# Patient Record
Sex: Male | Born: 2000 | Race: Black or African American | Hispanic: No | Marital: Single | Smoking: Never smoker
Health system: Southern US, Community
[De-identification: ages and names within clinical notes are randomized; demographics above are authoritative.]

## PROBLEM LIST (undated history)

## (undated) DIAGNOSIS — J45909 Unspecified asthma, uncomplicated: Secondary | ICD-10-CM

## (undated) HISTORY — PX: LEG SURGERY: SHX1003

## (undated) HISTORY — PX: EYE SURGERY: SHX253

---
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
## 2018-07-08 ENCOUNTER — Encounter (HOSPITAL_BASED_OUTPATIENT_CLINIC_OR_DEPARTMENT_OTHER): Payer: Self-pay | Admitting: *Deleted

## 2018-07-08 ENCOUNTER — Emergency Department (HOSPITAL_BASED_OUTPATIENT_CLINIC_OR_DEPARTMENT_OTHER)
Admission: EM | Admit: 2018-07-08 | Discharge: 2018-07-09 | Disposition: A | Discharge status: Home / Self Care | Payer: 59 | Attending: Emergency Medicine | Admitting: Emergency Medicine

## 2018-07-08 ENCOUNTER — Other Ambulatory Visit: Payer: Self-pay

## 2018-07-08 DIAGNOSIS — J029 Acute pharyngitis, unspecified: Secondary | ICD-10-CM | POA: Diagnosis not present

## 2018-07-08 DIAGNOSIS — B9789 Other viral agents as the cause of diseases classified elsewhere: Secondary | ICD-10-CM | POA: Diagnosis not present

## 2018-07-08 DIAGNOSIS — J45909 Unspecified asthma, uncomplicated: Secondary | ICD-10-CM | POA: Insufficient documentation

## 2018-07-08 DIAGNOSIS — Z79899 Other long term (current) drug therapy: Secondary | ICD-10-CM | POA: Insufficient documentation

## 2018-07-08 LAB — GROUP A STREP BY PCR: GROUP A STREP BY PCR: NOT DETECTED

## 2018-07-08 MED ORDER — DEXAMETHASONE 4 MG PO TABS
8.0000 mg | ORAL_TABLET | Freq: Once | ORAL | Status: AC
  Administered 2018-07-08: 8 mg via ORAL
  Filled 2018-07-08: qty 2

## 2018-07-08 NOTE — ED Notes (Signed)
C/o sorethroat x 2 days,  Runny nose and some congestion

## 2018-07-08 NOTE — ED Provider Notes (Signed)
  [facility] EMERGENCY DEPARTMENT Provider Note   CSN: [id number] Arrival date & time: 07/08/18  2231     History   Chief Complaint Chief Complaint  Patient presents with  . Sore Throat    HPI Anthony Hays is a 17 y.o. male.  The history is provided by the patient.  Sore Throat  This is a new problem. The current episode started 2 days ago. The problem occurs hourly. The problem has been gradually worsening. Pertinent negatives include no shortness of breath. The symptoms are aggravated by swallowing. The symptoms are relieved by rest.  Patient presents for sore throat for the past 1-2 days.  No fevers or vomiting Past Medical History:  Diagnosis Date  . Asthma     Patient Active Problem List   Diagnosis Date Noted  . Right knee injury, subsequent encounter 07/07/2017    Past Surgical History:  Procedure Laterality Date  . EYE SURGERY    . LEG SURGERY          Home Medications    Prior to Admission medications   Medication Sig Start Date End Date Taking? Authorizing Provider  Escitalopram Oxalate (LEXAPRO PO) Take by mouth.   Yes [provider]    Family History No family history on file.  Social History Social History   Tobacco Use  . Smoking status: Never Smoker  . Smokeless tobacco: Never Used  Substance Use Topics  . Alcohol use: No  . Drug use: No     Allergies   Patient has no known allergies.   Review of Systems Review of Systems  Constitutional: Positive for fatigue.  HENT: Positive for sore throat.   Respiratory: Negative for shortness of breath.      Physical Exam Updated Vital Signs BP 127/73   Pulse 68   Temp 98.8 F (37.1 C) (Oral)   Resp 20   Ht 1.676 m (5\' 6" )   Wt 50.8 kg   SpO2 99%   BMI 18.08 kg/m   Physical Exam CONSTITUTIONAL: Well developed/well nourished HEAD: Normocephalic/atraumatic EYES: EOMI/PERRL ENMT: Mucous membranes moist, uvula midline, tonsils enlarged with erythema, no  exudates, no drooling, no stridor, normal phonation NECK: supple no meningeal signs SPINE/BACK:entire spine nontender CV: S1/S2 noted, no murmurs/rubs/gallops noted LUNGS: Lungs are clear to auscultation bilaterally, no apparent distress ABDOMEN: soft, nontender NEURO: Pt is awake/alert/appropriate, moves all extremitiesx4.   EXTREMITIES: pulses normal/equal, full ROM SKIN: warm, color normal PSYCH: no abnormalities of mood noted, alert and oriented to situation   ED Treatments / Results  Labs (all labs ordered are listed, but only abnormal results are displayed) Labs Reviewed  GROUP A STREP BY PCR    EKG None  Radiology No results found.  Procedures Procedures (including critical care time)  Medications Ordered in ED Medications  dexamethasone (DECADRON) tablet 8 mg (has no administration in time range)     Initial Impression / Assessment and Plan / ED Course  I have reviewed the triage vital signs and the nursing notes.  Pertinent labs  results that were available during my care of the patient were reviewed by me and considered in my medical decision making (see chart for details).     We will treat for viral pharyngitis.  We discussed strict return precautions  Final Clinical Impressions(s) / ED Diagnoses   Final diagnoses:  Viral pharyngitis    ED Discharge Orders    None       Zadie Rhine, MD 07/08/18 2341

## 2018-07-08 NOTE — ED Triage Notes (Signed)
Sore throat since yesterday. Exudate on his swollen tonsils.

## 2019-01-21 IMAGING — CR DG KNEE COMPLETE 4+V*R*
4 series · 4 of 4 positions shown · non-contrast
Comparison: None.

CLINICAL DATA: Patient here for f/u of right tibial plateau
fracture, states that he isn't having any right knee pains
currently, no other complaints

EXAM:
RIGHT KNEE - COMPLETE 4+ VIEW

[t knee ap right]
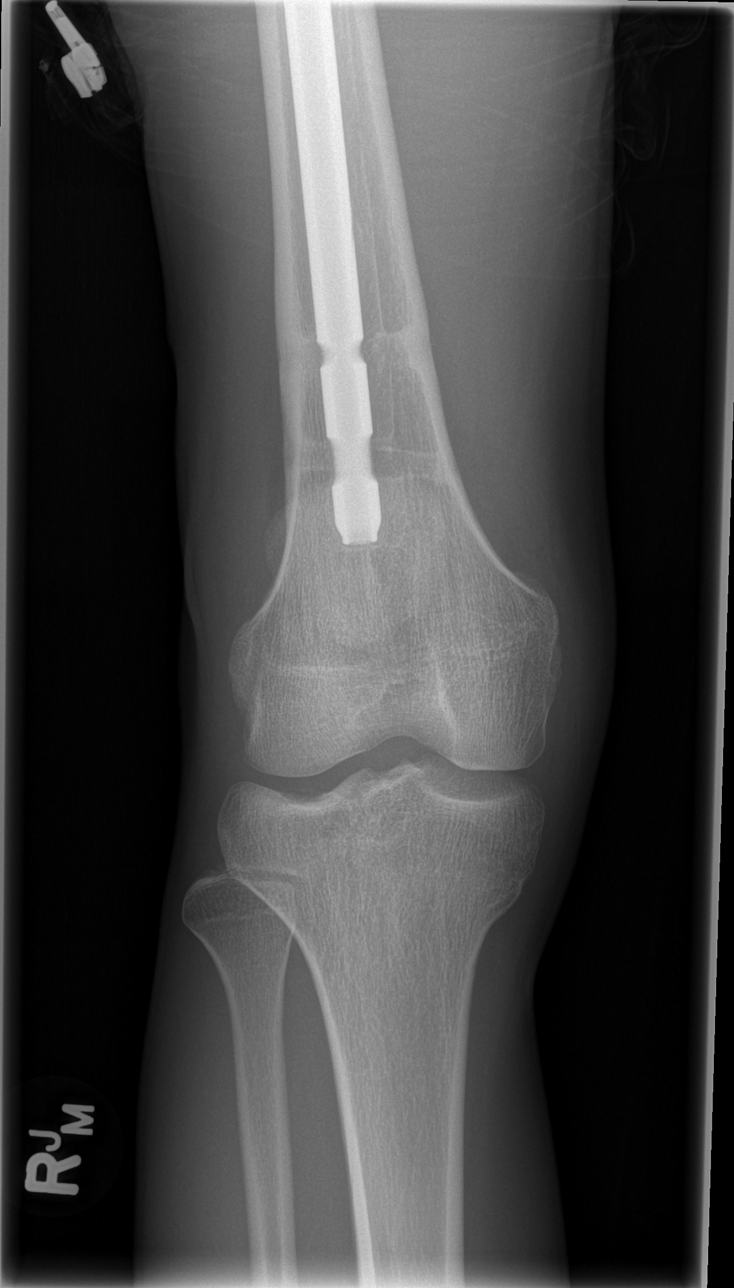

[t knee oblique right (1 of 2)]
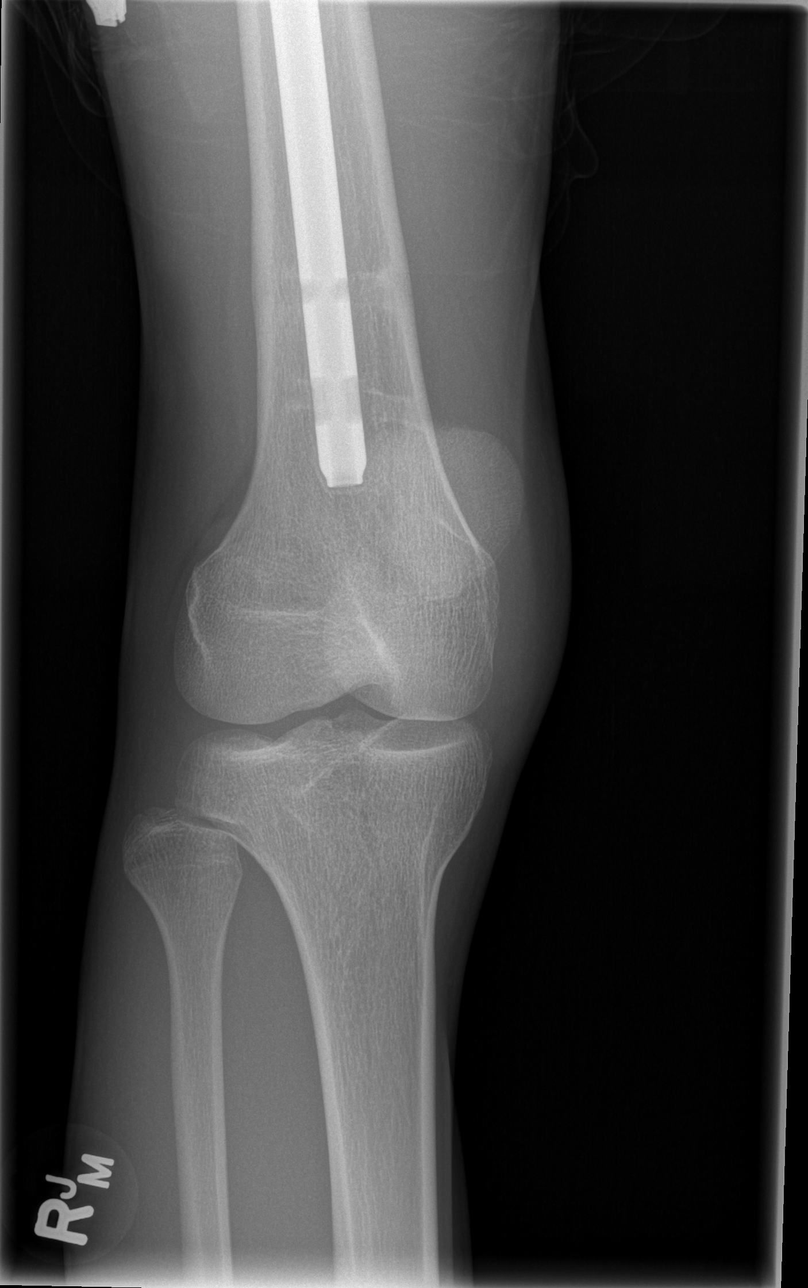

[t knee oblique right (2 of 2)]
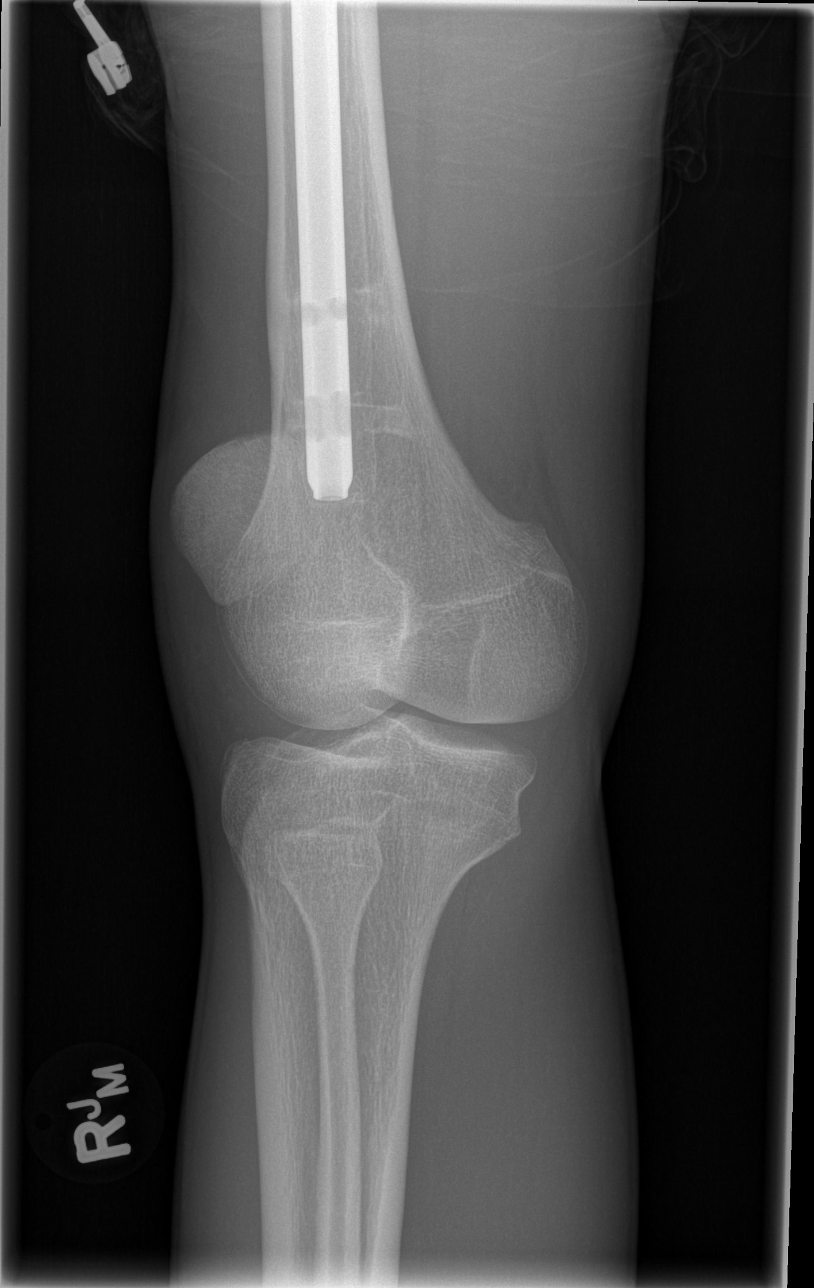

[t knee lat right]
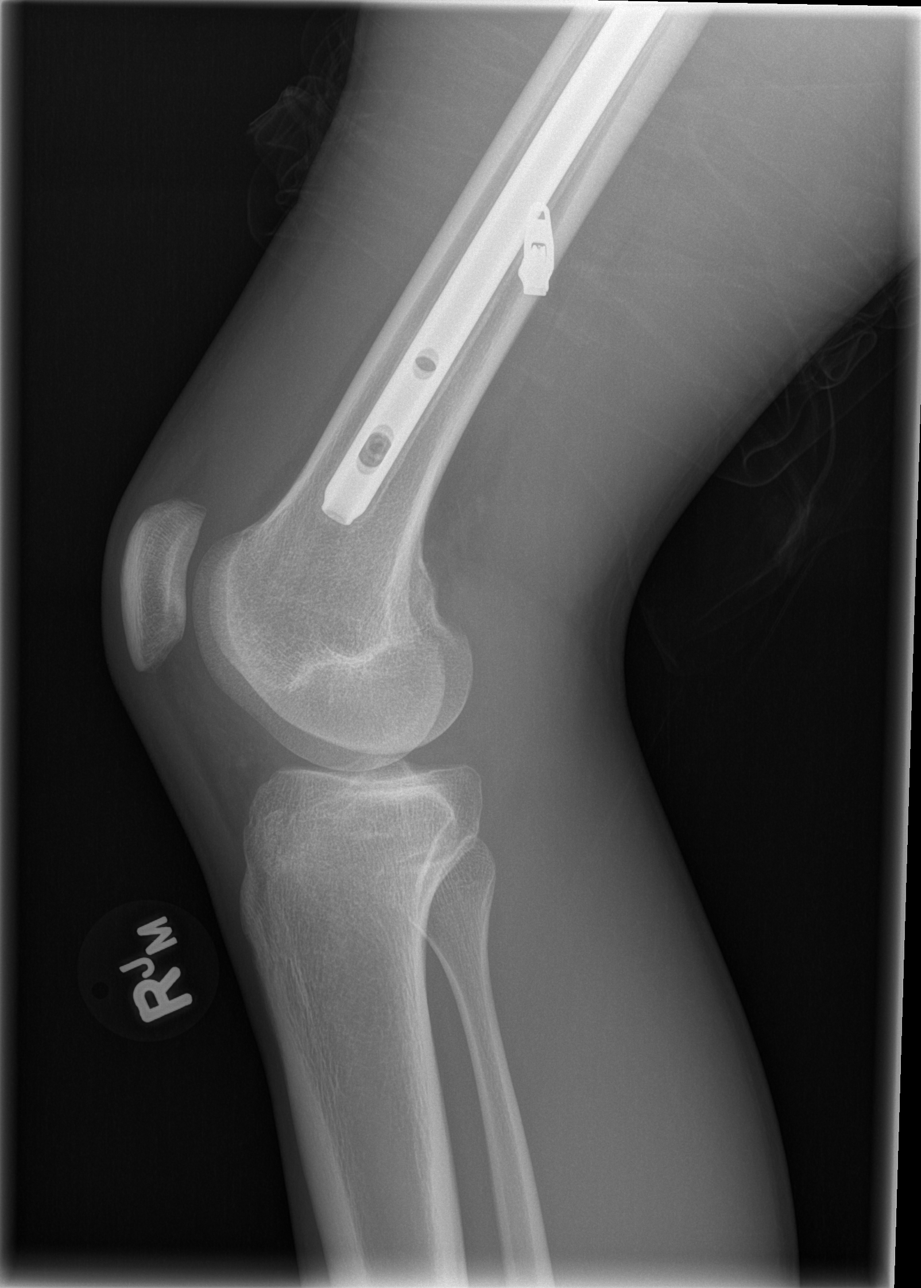

[4 of 4 positions shown; findings below may reference images not displayed]

FINDINGS: Healed lateral tibial plateau fracture with persistent 3 mm of
depression of the articular surface.

No acute fracture or dislocation. Partially visualize is a distal
femoral intramedullary nail.

No aggressive osseous lesion.  Moderate joint effusion.
IMPRESSION: 1. Healed lateral tibial plateau fracture with persistent 3 mm of
depression of the articular surface.
2. Moderate joint effusion.

## 2022-02-27 ENCOUNTER — Encounter (HOSPITAL_BASED_OUTPATIENT_CLINIC_OR_DEPARTMENT_OTHER): Payer: Self-pay | Admitting: Emergency Medicine

## 2022-02-27 ENCOUNTER — Other Ambulatory Visit: Payer: Self-pay

## 2022-02-27 ENCOUNTER — Emergency Department (HOSPITAL_BASED_OUTPATIENT_CLINIC_OR_DEPARTMENT_OTHER)
Admission: EM | Admit: 2022-02-27 | Discharge: 2022-02-27 | Disposition: A | Discharge status: Home / Self Care | Payer: Managed Care, Other (non HMO) | Source: Home / Self Care | Attending: Emergency Medicine | Admitting: Emergency Medicine

## 2022-02-27 ENCOUNTER — Emergency Department (HOSPITAL_BASED_OUTPATIENT_CLINIC_OR_DEPARTMENT_OTHER)
Admission: EM | Admit: 2022-02-27 | Discharge: 2022-02-27 | Disposition: A | Discharge status: Home / Self Care | Payer: Managed Care, Other (non HMO) | Attending: Emergency Medicine | Admitting: Emergency Medicine

## 2022-02-27 ENCOUNTER — Emergency Department (HOSPITAL_BASED_OUTPATIENT_CLINIC_OR_DEPARTMENT_OTHER): Payer: Managed Care, Other (non HMO)

## 2022-02-27 DIAGNOSIS — S0181XA Laceration without foreign body of other part of head, initial encounter: Secondary | ICD-10-CM | POA: Diagnosis not present

## 2022-02-27 DIAGNOSIS — Y9351 Activity, roller skating (inline) and skateboarding: Secondary | ICD-10-CM | POA: Insufficient documentation

## 2022-02-27 DIAGNOSIS — S62175A Nondisplaced fracture of trapezium [larger multangular], left wrist, initial encounter for closed fracture: Secondary | ICD-10-CM | POA: Diagnosis not present

## 2022-02-27 DIAGNOSIS — M25532 Pain in left wrist: Secondary | ICD-10-CM | POA: Diagnosis not present

## 2022-02-27 DIAGNOSIS — S0081XA Abrasion of other part of head, initial encounter: Secondary | ICD-10-CM | POA: Insufficient documentation

## 2022-02-27 DIAGNOSIS — S0990XD Unspecified injury of head, subsequent encounter: Secondary | ICD-10-CM

## 2022-02-27 DIAGNOSIS — S6992XA Unspecified injury of left wrist, hand and finger(s), initial encounter: Secondary | ICD-10-CM | POA: Diagnosis present

## 2022-02-27 MED ORDER — LIDOCAINE-EPINEPHRINE 2 %-1:100000 IJ SOLN: 20.0000 mL | Freq: Once | INTRAMUSCULAR | Status: DC

## 2022-02-27 MED ORDER — LIDOCAINE-EPINEPHRINE-TETRACAINE (LET) TOPICAL GEL
3.0000 mL | Freq: Once | TOPICAL | Status: AC
Start: 2022-02-27 — End: 2022-02-27
  Administered 2022-02-27: 3 mL via TOPICAL
  Filled 2022-02-27: qty 3

## 2022-02-27 MED ORDER — LIDOCAINE-EPINEPHRINE (PF) 2 %-1:200000 IJ SOLN
10.0000 mL | Freq: Once | INTRAMUSCULAR | Status: AC
  Administered 2022-02-27: 10 mL
  Filled 2022-02-27: qty 20
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
## 2022-02-27 NOTE — ED Provider Notes (Signed)
[facility] EMERGENCY DEPARTMENT Provider Note   CSN: [id number] Arrival date & time: 02/27/22  1302     History  Chief Complaint  Patient presents with   Head Injury    REFOEL SCALLION is a 21 y.o. male.  To ER chief complaint of a trickling fluid sensation in his head.  He states that he was seen here yesterday after a fall from a skateboard and head injury.  He was given stitches and discharged home.  He denies passing out he denies any nausea or vomiting denies any headache but has a "fluid trickling sensation" in his head he is concerned he may be due to some other injury.  No reports of fevers or cough or vomiting or diarrhea.  Denies any headache.      Home Medications Prior to Admission medications   Medication Sig Start Date End Date Taking? Authorizing Provider  Escitalopram Oxalate (LEXAPRO PO) Take by mouth.    [provider]      Allergies    Patient has no known allergies.    Review of Systems   Review of Systems  Constitutional:  Negative for fever.  HENT:  Negative for ear pain and sore throat.   Eyes:  Negative for pain.  Respiratory:  Negative for cough.   Cardiovascular:  Negative for chest pain.  Gastrointestinal:  Negative for abdominal pain.  Genitourinary:  Negative for flank pain.  Musculoskeletal:  Negative for back pain.  Skin:  Negative for color change and rash.  Neurological:  Negative for syncope.  All other systems reviewed and are negative.  Physical Exam Updated Vital Signs BP (!) 137/93 (BP Location: Right Arm)   Pulse 65   Temp 98.1 F (36.7 C) (Oral)   Resp 17   SpO2 99%  Physical Exam Constitutional:      Appearance: He is well-developed.  HENT:     Head: Normocephalic.     Nose: Nose normal.  Eyes:     Extraocular Movements: Extraocular movements intact.  Cardiovascular:     Rate and Rhythm: Normal rate.  Pulmonary:     Effort: Pulmonary effort is normal.  Skin:    Coloration: Skin is not  jaundiced.     Comments: Left facial abrasions, no evidence of cellulitis.  Neurological:     Mental Status: He is alert. Mental status is at baseline.    ED Results / Procedures / Treatments   Labs (all labs ordered are listed, but only abnormal results are displayed) Labs Reviewed - No data to display  EKG None  Radiology DG Wrist Complete Left  Result Date: 02/27/2022 CLINICAL DATA:  Left wrist injury EXAM: LEFT WRIST - COMPLETE 3+ VIEW COMPARISON:  None Available. FINDINGS: There is a a cortical step-off and linear lucency through the trapezium in keeping with a minimally displaced, anatomically aligned intra-articular fracture. Normal overall alignment. No other fracture or dislocation. Marked soft tissue swelling along the dorsum of the carpus. IMPRESSION: Minimally displaced anatomically aligned intra-articular fracture of the trapezium at the base of the thumb. Dorsal soft tissue swelling. Electronically Signed   By: Fidela Salisbury M.D.   On: 02/27/2022 00:27    Procedures Procedures    Medications Ordered in ED Medications - No data to display  ED Course/ Medical Decision Making/ A&P                           Medical Decision Making  Chart review  shows office visit Feb 24, 2022 for routine medical exam.  Risk and benefits of CT imaging discussed at length with the patient.  70 May to forego imaging at this time.  Patient advised outpatient follow-up with his doctor within the week, advised immediate return if he has persistent vomiting pain fevers or any additional concerns.        Final Clinical Impression(s) / ED Diagnoses Final diagnoses:  Injury of head, subsequent encounter    Rx / DC Orders ED Discharge Orders     None         Luna Fuse, MD 02/27/22 1408
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
## 2022-02-27 NOTE — Discharge Instructions (Addendum)
The likelihood of there being an intracranial hemorrhage or injury is very low.  Follow-up with your doctor within the week.  Return back to the ER if you have persistent vomiting persistent pain or any additional concerns.

## 2022-02-27 NOTE — ED Provider Notes (Signed)
[facility] EMERGENCY DEPARTMENT Provider Note   CSN: [id number] Arrival date & time: 02/27/22  0001     History  Chief Complaint  Patient presents with   Laceration    Anthony Hays is a 21 y.o. male.  The history is provided by the patient.  Laceration Anthony Brittleodd R Ardelean is a 21 y.o. male who presents to the Emergency Department complaining of skateboarding accident.  He presents to the emergency department for evaluation of injuries after a skateboarding accident that occurred around 730 this afternoon.  He states that he was riding down a hill when he fell, putting out his hands and striking his face.  No loss of consciousness, nausea, vomiting.  He does have mild pain to the left wrist.  He was able to clean his wounds at home.  Last tetanus was in 2018.  No known medical problems.  He is right-hand dominant.   Home Medications Prior to Admission medications   Medication Sig Start Date End Date Taking? Authorizing Provider  Escitalopram Oxalate (LEXAPRO PO) Take by mouth.    [provider]      Allergies    Patient has no known allergies.    Review of Systems   Review of Systems  All other systems reviewed and are negative.  Physical Exam Updated Vital Signs BP (!) 132/92 (BP Location: Right Arm)   Pulse 86   Temp 98.6 F (37 C) (Oral)   Resp 18   SpO2 99%  Physical Exam Vitals and nursing note reviewed.  Constitutional:      Appearance: He is well-developed.  HENT:     Head: Normocephalic.     Comments: Pupils equal round and reactive, EOMI.  There are abrasions to the left cheek.  There is a 2 cm V-shaped laceration to the lateral aspect of the left eye involving the upper lid and lower lid. Cardiovascular:     Rate and Rhythm: Normal rate and regular rhythm.  Pulmonary:     Effort: Pulmonary effort is normal. No respiratory distress.  Abdominal:     Palpations: Abdomen is soft.     Tenderness: There is no abdominal tenderness. There is no  guarding or rebound.  Musculoskeletal:     Cervical back: Neck supple.     Comments: There is mild tenderness and swelling over the left thenar eminence.  There are abrasions to the palmar aspect of bilateral hands.  Flexion extension intact against resistance throughout the left first digit, second digit.  Sensation to light touch intact throughout bilateral hands.  2+ radial pulses bilaterally.  Skin:    General: Skin is warm and dry.  Neurological:     Mental Status: He is alert and oriented to person, place, and time.     Comments: No asymmetry of facial movements, 5 out of 5 strength in all 4 extremities  Psychiatric:        Behavior: Behavior normal.    ED Results / Procedures / Treatments   Labs (all labs ordered are listed, but only abnormal results are displayed) Labs Reviewed - No data to display  EKG None  Radiology DG Wrist Complete Left  Result Date: 02/27/2022 CLINICAL DATA:  Left wrist injury EXAM: LEFT WRIST - COMPLETE 3+ VIEW COMPARISON:  None Available. FINDINGS: There is a a cortical step-off and linear lucency through the trapezium in keeping with a minimally displaced, anatomically aligned intra-articular fracture. Normal overall alignment. No other fracture or dislocation. Marked soft tissue swelling along the  dorsum of the carpus. IMPRESSION: Minimally displaced anatomically aligned intra-articular fracture of the trapezium at the base of the thumb. Dorsal soft tissue swelling. Electronically Signed   By: Helyn Numbers M.D.   On: 02/27/2022 00:27    Procedures .Marland KitchenLaceration Repair  Date/Time: 02/27/2022 3:03 AM Performed by: Tilden Fossa, MD Authorized by: Tilden Fossa, MD   Consent:    Consent obtained:  Verbal   Consent given by:  Patient   Risks discussed:  Infection and poor cosmetic result Universal protocol:    Patient identity confirmed:  Verbally with patient Anesthesia:    Anesthesia method:  Topical application and local infiltration    Topical anesthetic:  LET   Local anesthetic:  Lidocaine 2% WITH epi Laceration details:    Location:  Face   Face location:  L upper eyelid (Upper and lower eyelid)   Length (cm):  2 Pre-procedure details:    Preparation:  Patient was prepped and draped in usual sterile fashion Exploration:    Wound exploration: wound explored through full range of motion   Treatment:    Area cleansed with:  Povidone-iodine and saline   Amount of cleaning:  Standard   Debridement:  None Skin repair:    Repair method:  Sutures   Suture size:  5-0   Suture material:  Prolene   Suture technique:  Simple interrupted   Number of sutures:  2 Approximation:    Approximation:  Close Repair type:    Repair type:  Simple Post-procedure details:    Dressing:  Antibiotic ointment   Procedure completion:  Tolerated well, no immediate complications   SPLINT APPLICATION Date/Time: 3:06 AM Authorized by: Tilden Fossa Consent: Verbal consent obtained. Risks and benefits: risks, benefits and alternatives were discussed Consent given by: patient Splint applied by: ED tech Location details: LUE Splint type: thumb spica Supplies used: orthoglass, ace wrap Post-procedure: The splinted body part was neurovascularly unchanged following the procedure. Patient tolerance: Patient tolerated the procedure well with no immediate complications.   Medications Ordered in ED Medications  lidocaine-EPINEPHrine-tetracaine (LET) topical gel (3 mLs Topical Given 02/27/22 0210)  lidocaine-EPINEPHrine (XYLOCAINE W/EPI) 2 %-1:200000 (PF) injection 10 mL (10 mLs Infiltration Given 02/27/22 0254)    ED Course/ Medical Decision Making/ A&P                           Medical Decision Making Amount and/or Complexity of Data Reviewed Radiology: ordered.  Risk Prescription drug management.   Patient here for evaluation of injuries after falling off a skateboard.  He does have facial abrasions as well as a left periorbital  laceration that involves the upper and lower lid.  No evidence of serious closed head injury.  In terms of his facial laceration this was repaired per note.  He also has abrasions to bilateral hands, images significant for trapezium fracture of the left hand - personally reviewed and interpreted.  He is neurovascularly intact on examination with no evidence of tendon injury.  He was placed in a thumb spica splint.  Discussed with patient home care for facial laceration, trapezium fracture.  Discussed outpatient follow-up and return precautions.        Final Clinical Impression(s) / ED Diagnoses Final diagnoses:  Closed nondisplaced fracture of trapezium of left wrist, initial encounter  Facial laceration, initial encounter    Rx / DC Orders ED Discharge Orders     None         Tilden Fossa,  MD 02/27/22 6314

## 2022-03-13 ENCOUNTER — Ambulatory Visit
Admission: RE | Admit: 2022-03-13 | Discharge: 2022-03-13 | Disposition: A | Discharge status: Home / Self Care | Payer: Managed Care, Other (non HMO) | Source: Ambulatory Visit | Attending: Orthopedic Surgery | Admitting: Orthopedic Surgery

## 2022-03-13 ENCOUNTER — Other Ambulatory Visit: Payer: Self-pay | Admitting: Orthopedic Surgery

## 2022-03-13 DIAGNOSIS — S62172D Displaced fracture of trapezium [larger multangular], left wrist, subsequent encounter for fracture with routine healing: Secondary | ICD-10-CM
